# Patient Record
Sex: Female | Born: 1990 | Race: Black or African American | Marital: Single | Smoking: Former smoker
Health system: Southern US, Community
[De-identification: ages and names within clinical notes are randomized; demographics above are authoritative.]

## PROBLEM LIST (undated history)

## (undated) HISTORY — PX: INDUCED ABORTION: SHX677

---

## 2014-08-08 HISTORY — PX: LEEP: SHX91

## 2016-05-07 ENCOUNTER — Emergency Department: Payer: Medicaid Other

## 2016-05-07 ENCOUNTER — Emergency Department
Admission: EM | Admit: 2016-05-07 | Discharge: 2016-05-07 | Disposition: A | Discharge status: Home / Self Care | Payer: Medicaid Other | Attending: Student | Admitting: Student

## 2016-05-07 ENCOUNTER — Encounter: Payer: Self-pay | Admitting: Emergency Medicine

## 2016-05-07 DIAGNOSIS — O26899 Other specified pregnancy related conditions, unspecified trimester: Secondary | ICD-10-CM

## 2016-05-07 DIAGNOSIS — Z3A00 Weeks of gestation of pregnancy not specified: Secondary | ICD-10-CM | POA: Insufficient documentation

## 2016-05-07 DIAGNOSIS — M5442 Lumbago with sciatica, left side: Secondary | ICD-10-CM | POA: Insufficient documentation

## 2016-05-07 DIAGNOSIS — O99331 Smoking (tobacco) complicating pregnancy, first trimester: Secondary | ICD-10-CM | POA: Diagnosis not present

## 2016-05-07 DIAGNOSIS — B373 Candidiasis of vulva and vagina: Secondary | ICD-10-CM | POA: Diagnosis not present

## 2016-05-07 DIAGNOSIS — O200 Threatened abortion: Secondary | ICD-10-CM

## 2016-05-07 DIAGNOSIS — R102 Pelvic and perineal pain: Secondary | ICD-10-CM | POA: Diagnosis not present

## 2016-05-07 DIAGNOSIS — F17200 Nicotine dependence, unspecified, uncomplicated: Secondary | ICD-10-CM | POA: Diagnosis not present

## 2016-05-07 DIAGNOSIS — R109 Unspecified abdominal pain: Secondary | ICD-10-CM

## 2016-05-07 DIAGNOSIS — M5432 Sciatica, left side: Secondary | ICD-10-CM

## 2016-05-07 DIAGNOSIS — B3731 Acute candidiasis of vulva and vagina: Secondary | ICD-10-CM

## 2016-05-07 LAB — COMPREHENSIVE METABOLIC PANEL
ALBUMIN: 3.7 g/dL (ref 3.5–5.0)
ALK PHOS: 62 U/L (ref 38–126)
ALT: 31 U/L (ref 14–54)
AST: 22 U/L (ref 15–41)
Anion gap: 4 — ABNORMAL LOW (ref 5–15)
BILIRUBIN TOTAL: 0.5 mg/dL (ref 0.3–1.2)
BUN: 13 mg/dL (ref 6–20)
CALCIUM: 8.9 mg/dL (ref 8.9–10.3)
CO2: 25 mmol/L (ref 22–32)
Chloride: 108 mmol/L (ref 101–111)
Creatinine, Ser: 0.67 mg/dL (ref 0.44–1.00)
GFR calc Af Amer: >60 mL/min (ref >60)
GFR calc non Af Amer: >60 mL/min (ref >60)
GLUCOSE: 94 mg/dL (ref 65–99)
Potassium: 4 mmol/L (ref 3.5–5.1)
Sodium: 137 mmol/L (ref 135–145)
TOTAL PROTEIN: 7.3 g/dL (ref 6.5–8.1)

## 2016-05-07 LAB — CBC WITH DIFFERENTIAL/PLATELET
BASOS ABS: 0 10*3/uL (ref 0–0.1)
BASOS PCT: 1 %
Eosinophils Absolute: 0.1 10*3/uL (ref 0–0.7)
Eosinophils Relative: 2 %
HEMATOCRIT: 38.1 % (ref 35.0–47.0)
HEMOGLOBIN: 13.2 g/dL (ref 12.0–16.0)
Lymphocytes Relative: 35 %
Lymphs Abs: 2.9 10*3/uL (ref 1.0–3.6)
MCH: 29.3 pg (ref 26.0–34.0)
MCHC: 34.6 g/dL (ref 32.0–36.0)
MCV: 84.9 fL (ref 80.0–100.0)
MONOS PCT: 8 %
Monocytes Absolute: 0.7 10*3/uL (ref 0.2–0.9)
NEUTROS ABS: 4.4 10*3/uL (ref 1.4–6.5)
NEUTROS PCT: 54 %
Platelets: 234 10*3/uL (ref 150–440)
RBC: 4.48 MIL/uL (ref 3.80–5.20)
RDW: 13.9 % (ref 11.5–14.5)
WBC: 8.2 10*3/uL (ref 3.6–11.0)

## 2016-05-07 LAB — URINALYSIS COMPLETE WITH MICROSCOPIC (ARMC ONLY)
Bilirubin Urine: NEGATIVE
GLUCOSE, UA: NEGATIVE mg/dL
Ketones, ur: NEGATIVE mg/dL
Nitrite: NEGATIVE
PROTEIN: NEGATIVE mg/dL
Specific Gravity, Urine: 1.025 (ref 1.005–1.030)
pH: 5 (ref 5.0–8.0)

## 2016-05-07 LAB — WET PREP, GENITAL
CLUE CELLS WET PREP: NONE SEEN
Sperm: NONE SEEN
Trich, Wet Prep: NONE SEEN

## 2016-05-07 LAB — CHLAMYDIA/NGC RT PCR (ARMC ONLY)
Chlamydia Tr: NOT DETECTED
N GONORRHOEAE: NOT DETECTED

## 2016-05-07 LAB — LIPASE, BLOOD: Lipase: 22 U/L (ref 11–51)

## 2016-05-07 LAB — POCT PREGNANCY, URINE: PREG TEST UR: POSITIVE — AB

## 2016-05-07 LAB — HCG, QUANTITATIVE, PREGNANCY: hCG, Beta Chain, Quant, S: 11987 m[IU]/mL — ABNORMAL HIGH (ref <5)

## 2016-05-07 MED ORDER — ACETAMINOPHEN 500 MG PO TABS: 1000.0000 mg | ORAL_TABLET | Freq: Three times a day (TID) | ORAL | 0 refills | Status: AC | PRN

## 2016-05-07 MED ORDER — CLOTRIMAZOLE 1 % VA CREA: 1.0000 | TOPICAL_CREAM | Freq: Every day | VAGINAL | 0 refills | Status: AC

## 2016-05-07 MED ORDER — ACETAMINOPHEN 500 MG PO TABS
1000.0000 mg | ORAL_TABLET | Freq: Once | ORAL | Status: AC
  Administered 2016-05-07: 1000 mg via ORAL
  Filled 2016-05-07: qty 2

## 2016-05-07 NOTE — ED Triage Notes (Addendum)
Pt report left sided abdominal pain and left buttocks pain that worsened since yesterday. Pt reports nausea, but denies vomiting.  Pt states buttocks pain on left side radiates down left leg. Pt is ambulatory in triage. Pt states she took a pregnancy test on Monday and it was positive.

## 2016-05-07 NOTE — ED Provider Notes (Signed)
[facility] Provider Note   ____________________________________________   First MD Initiated Contact with Patient 05/07/16 0945     (approximate)  I have reviewed the triage vital signs and the nursing notes.   HISTORY  Chief Complaint Abdominal Pain    HPI Julie Zhang Legacy is a 25 y.o. female G3P1011 at approximately 5 weeks estimated gestational age by last menstrual period who presents for evaluation of one week of left lumbar back pain radiating down the posterior thigh, gradual onset, intermittent, currently moderate, no modifying factors. She denies any fevers, trauma to the back, history of malignancy, numbness or weakness in the legs, loss of control of bowel or bladder. She also reports some intermittent dull lower abdominal pain and recently took a pregnancy test that was positive at home. She has had thick vaginal discharge as well as vaginal itching. She denies any abnormal vaginal bleeding. She was treated for Trichomonas last month. No nausea, vomiting, diarrhea, fevers or chills.   History reviewed. No pertinent past medical history.  There are no active problems to display for this patient.   Past Surgical History:  Procedure Laterality Date  . INDUCED ABORTION      Prior to Admission medications   Not on File    Allergies Iodine  History reviewed. No pertinent family history.  Social History Social History  Substance Use Topics  . Smoking status: Current Some Day Smoker  . Smokeless tobacco: Never Used  . Alcohol use Yes    Review of Systems Constitutional: No fever/chills Eyes: No visual changes. ENT: No sore throat. Cardiovascular: Denies chest pain. Respiratory: Denies shortness of breath. Gastrointestinal: +abdominal pain.  No nausea, no vomiting.  No diarrhea.  No constipation. Genitourinary: Negative for dysuria. Musculoskeletal: Positive for lumbar back pain. Skin: Negative for  rash. Neurological: Negative for headaches, focal weakness or numbness.  10-point ROS otherwise negative.  ____________________________________________   PHYSICAL EXAM:  Vitals:   05/07/16 0917 05/07/16 0918 05/07/16 1042 05/07/16 1305  BP:  100/66 109/70 107/69  Pulse:  75  65  Resp:  18 16 16   Temp:  98.3 F (36.8 C)    TempSrc:  Oral    SpO2:  99% 99% 99%  Weight: 166 lb (75.3 kg)     Height: 5\' 1"  (1.549 m)       VITAL SIGNS: ED Triage Vitals  Enc Vitals Group     BP 05/07/16 0918 100/66     Pulse Rate 05/07/16 0918 75     Resp 05/07/16 0918 18     Temp 05/07/16 0918 98.3 F (36.8 C)     Temp Source 05/07/16 0918 Oral     SpO2 05/07/16 0918 99 %     Weight 05/07/16 0917 166 lb (75.3 kg)     Height 05/07/16 0917 5\' 1"  (1.549 m)     Head Circumference --      Peak Flow --      Pain Score 05/07/16 0918 8     Pain Loc --      Pain Edu? --      Excl. in GC? --     Constitutional: Alert and oriented. Well appearing and in no acute distress. Eyes: Conjunctivae are normal. PERRL. EOMI. Head: Atraumatic. Nose: No congestion/rhinnorhea. Mouth/Throat: Mucous membranes are moist.  Oropharynx non-erythematous. Neck: No stridor.  Supple without meningismus. Cardiovascular: Normal rate, regular rhythm. Grossly normal heart sounds.  Good peripheral circulation. Respiratory: Normal respiratory effort.  No retractions. Lungs CTAB.  Gastrointestinal: Soft and nontender. No distention. Normal bowel sounds No CVA tenderness.  Pelvic: Thick heterogeneous white discharge from a closed os, no blood, no bimanual or cervical motion tenderness. Musculoskeletal: No lower extremity tenderness nor edema.  No joint effusions. No midline T or L-spine tenderness to palpation, no tenderness to palpation throughout the paravertebral muscles. Neurologic:  Normal speech and language. No gross focal neurologic deficits are appreciated. No gait instability. Positive straight leg raise at 45 in the  left leg. 5 out of 5 strength of dorsiflexion of the big toes bilaterally. Skin:  Skin is warm, dry and intact. No rash noted. Psychiatric: Mood and affect are normal. Speech and behavior are normal.  ____________________________________________   LABS (all labs ordered are listed, but only abnormal results are displayed)  Labs Reviewed  WET PREP, GENITAL - Abnormal; Notable for the following:       Result Value   Yeast Wet Prep HPF POC PRESENT (*)    WBC, Wet Prep HPF POC FEW (*)    All other components within normal limits  COMPREHENSIVE METABOLIC PANEL - Abnormal; Notable for the following:    Anion gap 4 (*)    All other components within normal limits  URINALYSIS COMPLETEWITH MICROSCOPIC ([facility] ONLY) - Abnormal; Notable for the following:    Color, Urine YELLOW (*)    APPearance HAZY (*)    Hgb urine dipstick 1+ (*)    Leukocytes, UA TRACE (*)    Bacteria, UA RARE (*)    Squamous Epithelial / LPF 6-30 (*)    All other components within normal limits  HCG, QUANTITATIVE, PREGNANCY - Abnormal; Notable for the following:    hCG, Beta Chain, Quant, S 11,987 (*)    All other components within normal limits  POCT PREGNANCY, URINE - Abnormal; Notable for the following:    Preg Test, Ur POSITIVE (*)    All other components within normal limits  CHLAMYDIA/NGC RT PCR ([facility] ONLY)  CBC WITH DIFFERENTIAL/PLATELET  LIPASE, BLOOD  POC URINE PREG, ED   ____________________________________________  EKG  none ____________________________________________  RADIOLOGY  Transvaginal ultrasound IMPRESSION:  1. Probable early intrauterine pregnancy with a well-formed  gestational sac and yolk sac, but no embryo. Recommend follow-up  ultrasound in 7-10 days to document normal progression.  2. Small subchronic hemorrhage.  3. Moderate pelvic free fluid greater than generally seen for  physiologic free fluid. This is of unclear etiology.  4. No adnexal masses. Complex right  ovarian cyst consistent with a  corpus luteum.     ____________________________________________   PROCEDURES  Procedure(s) performed: None  Procedures  Critical Care performed: No  ____________________________________________   INITIAL IMPRESSION / ASSESSMENT AND PLAN / ED COURSE  Pertinent labs & imaging results that were available during my care of the patient were reviewed by me and considered in my medical decision making (see chart for details).  Raymonde Hamblin is a 25 y.o. female G3P1011 at approximately 5 weeks estimated gestational age by last menstrual period who presents for evaluation of one week of left lumbar back pain radiating down the posterior thigh. On exam, she is very well-appearing and in no acute distress. Her vital signs stable and she is afebrile. She is an intact neurological examination, no red flecks concerning for cauda equina or epidural abscess, suspect her back pain is secondary to sciatica, we'll treat with Tylenol. She has a benign abdominal exam however given her complaint of mild abdominal pain in early pregnancy we'll obtain screening labs, hCG,  transvaginal ultrasound to rule out ectopic. Reassess for disposition.  ----------------------------------------- 1:21 PM on 05/07/2016 ----------------------------------------- Patient with improvement back pain with Tylenol. She continues to appear well. Her labs, CBC unremarkable. Unremarkable CMP, normal lipase. Urinalysis is not consistent with infection. HCG was elevated greater than 11,000. Transvaginal ultrasound shows probable IUP with gestational and yolk sac present though no embryo is visualized. There is also a right-sided ovarian cyst as well as increased free fluid however I discussed this with Dr. Bonney Aid of OB/GYN who reports that this is nonspecific and in the absence of leukocytosis or fever or severe pain, it could represent a ruptured corpus luteum. He recommends follow-up in clinic. I  discussed this with the patient, we discussed thereturn precautions and need for close follow-up and she is comfortable with discharge plan. She does have a yeast infection we'll discharge with topical clotrimazole.  Clinical Course  Value Comment By Time  Protein: NEGATIVE (Reviewed) Gayla Doss, MD 09/30 1321     ____________________________________________   FINAL CLINICAL IMPRESSION(S) / ED DIAGNOSES  Final diagnoses:  Sciatica of left side  Abdominal pain in pregnancy  Yeast vaginitis  Threatened miscarriage in early pregnancy      NEW MEDICATIONS STARTED DURING THIS VISIT:  New Prescriptions   No medications on file     Note:  This document was prepared using Dragon voice recognition software and may include unintentional dictation errors.    Gayla Doss, MD 05/07/16 1324

## 2016-05-31 ENCOUNTER — Other Ambulatory Visit: Payer: Self-pay | Admitting: Obstetrics and Gynecology

## 2016-05-31 DIAGNOSIS — Z369 Encounter for antenatal screening, unspecified: Secondary | ICD-10-CM

## 2016-06-27 ENCOUNTER — Ambulatory Visit
Admission: RE | Admit: 2016-06-27 | Discharge: 2016-06-27 | Disposition: A | Discharge status: Home / Self Care | Payer: Medicaid Other | Source: Ambulatory Visit | Attending: Maternal & Fetal Medicine | Admitting: Maternal & Fetal Medicine

## 2016-06-27 DIAGNOSIS — Z315 Encounter for genetic counseling: Secondary | ICD-10-CM | POA: Diagnosis not present

## 2016-06-27 DIAGNOSIS — Z369 Encounter for antenatal screening, unspecified: Secondary | ICD-10-CM | POA: Insufficient documentation

## 2016-06-27 NOTE — Progress Notes (Signed)
Pt seen by me.  Agree with assessment and plan as outline by CGC Wells.  

## 2016-06-27 NOTE — Progress Notes (Signed)
Referring physician:  [facility] Length of Consultation: 40 minutes   Julie Zhang  was referred to [facility] for genetic counseling to review prenatal screening and testing options.  This note summarizes the information we discussed.    We offered the following routine screening tests for this pregnancy:  First trimester screening, which includes nuchal translucency ultrasound screen and first trimester maternal serum marker screening.  The nuchal translucency has approximately an 80% detection rate for Down syndrome and can be positive for other chromosome abnormalities as well as congenital heart defects.  When combined with a maternal serum marker screening, the detection rate is up to 90% for Down syndrome and up to 97% for trisomy 18.     Maternal serum marker screening, a blood test that measures pregnancy proteins, can provide risk assessments for Down syndrome, trisomy 18, and open neural tube defects (spina bifida, anencephaly). Because it does not directly examine the fetus, it cannot positively diagnose or rule out these problems.  Targeted ultrasound uses high frequency sound waves to create an image of the developing fetus.  An ultrasound is often recommended as a routine means of evaluating the pregnancy.  It is also used to screen for fetal anatomy problems (for example, a heart defect) that might be suggestive of a chromosomal or other abnormality.   Should these screening tests indicate an increased concern, then the following additional testing options would be offered:  The chorionic villus sampling procedure is available for first trimester chromosome analysis.  This involves the withdrawal of a small amount of chorionic villi (tissue from the developing placenta).  Risk of pregnancy loss is estimated to be approximately 1 in 200 to 1 in 100 (0.5 to 1%).  There is approximately a 1% (1 in 100) chance that the CVS chromosome results will be unclear.   Chorionic villi cannot be tested for neural tube defects.     Amniocentesis involves the removal of a small amount of amniotic fluid from the sac surrounding the fetus with the use of a thin needle inserted through the maternal abdomen and uterus.  Ultrasound guidance is used throughout the procedure.  Fetal cells from amniotic fluid are directly evaluated and > 99.5% of chromosome problems and > 98% of open neural tube defects can be detected. This procedure is generally performed after the 15th week of pregnancy.  The main risks to this procedure include complications leading to miscarriage in less than 1 in 200 cases (0.5%).  As another option for information if the pregnancy is suspected to be an an increased chance for certain chromosome conditions, we also reviewed the availability of cell free fetal DNA testing from maternal blood to determine whether or not the baby may have either Down syndrome, trisomy 64, or trisomy 3.  This test utilizes a maternal blood sample and DNA sequencing technology to isolate circulating cell free fetal DNA from maternal plasma.  The fetal DNA can then be analyzed for DNA sequences that are derived from the three most common chromosomes involved in aneuploidy, chromosomes 13, 18, and 21.  If the overall amount of DNA is greater than the expected level for any of these chromosomes, aneuploidy is suspected.  While we do not consider it a replacement for invasive testing and karyotype analysis, a negative result from this testing would be reassuring, though not a guarantee of a normal chromosome complement for the baby.  An abnormal result is certainly suggestive of an abnormal chromosome complement, though  we would still recommend CVS or amniocentesis to confirm any findings from this testing.   Cystic Fibrosis and Spinal Muscular Atrophy (SMA) screening were also discussed with the patient. Both conditions are recessive, which means that both parents must be carriers in  order to have a child with the disease.  Cystic fibrosis (CF) is one of the most common genetic conditions in persons of Caucasian ancestry.  This condition occurs in approximately 1 in 2,500 Caucasian persons and results in thickened secretions in the lungs, digestive, and reproductive systems.  For a baby to be at risk for having CF, both of the parents must be carriers for this condition.  Approximately 1 in 5025 Caucasian persons is a carrier for CF.  Current carrier testing looks for the most common mutations in the gene for CF and can detect approximately 90% of carriers in the Caucasian population.  This means that the carrier screening can greatly reduce, but cannot eliminate, the chance for an individual to have a child with CF.  If an individual is found to be a carrier for CF, then carrier testing would be available for the partner. As part of [place] newborn screening profile, all babies born in the state of [place] will have a two-tier screening process.  Specimens are first tested to determine the concentration of immunoreactive trypsinogen (IRT).  The top 5% of specimens with the highest IRT values then undergo DNA testing using a panel of over 40 common CF mutations. SMA is a neurodegenerative disorder that leads to atrophy of skeletal muscle and overall weakness.  This condition is also more prevalent in the Caucasian population, with 1 in 40-1 in 60 persons being a carrier and 1 in 6,000-1 in 10,000 children being affected.  There are multiple forms of the disease, with some causing death in infancy to other forms with survival into adulthood.  The genetics of SMA is complex, but carrier screening can detect up to 95% of carriers in the Caucasian population.  Similar to CF, a negative result can greatly reduce, but cannot eliminate, the chance to have a child with SMA.  We obtained a detailed family history and pregnancy history.  Julie Zhang reported that her mother and maternal aunt  both had colon cancer.  We reviewed that most cases of colon cancer occur by chance, but that there are some families with strong genetic factors.  If she would like to speak with someone about this history, we are happy to connect her with a cancer genetic counselor.  We would recommend that she follow screening guidelines as recommended by her primary care doctor.  She also stated that she has twin maternal first cousins once removed with alopecia, or hair loss.  There may be different types of this condition and different causes.  Without additional medical information it is difficult to determine if other family members would be at increased risk for a similar condition.  The remainder of the family history was reported to be unremarkable for birth defects, mental retardation, recurrent pregnancy loss or known chromosome abnormalities.  Ms. Scherrie Zhang stated that this is her third pregnancy, the first with her current partner.  She has one daughter who is in good health and had one elective termination.  She reported no complications in the current pregnancy.  The patient did report stopping both alcohol use and cigarette smoking at 4-[redacted] weeks gestation when she learned that she was pregnancy.  This is likely during the all or none period.  After consideration of the options, Ms. Shahara Zhang elected to proceed with an ultrasound and to declined all other screening and testing.  An ultrasound was performed at the time of the visit.  The gestational age was consistent with 12 weeks.  Fetal anatomy could not be assessed due to early gestational age.  Please refer to the ultrasound report for details of that study.  Ms. Monita Zhang was encouraged to call with questions or concerns.  We can be contacted at [phone].    Cherly Andersoneborah F. Danissa Rundle, MS, CGC

## 2016-08-08 NOTE — L&D Delivery Note (Signed)
Delivery Note At 6:48 PM on 01/09/17 a viable female was delivered via Vaginal, Spontaneous Delivery (Presentation: ;vtx- ROA  ).  APGAR: 8, 9; weight 6 lb 9 oz (2977 g).   Placenta status:intact  , .  Cord: 3v delayed cord clamping  with the following complications:none .    Anesthesia:  N2O2, lidocaine Episiotomy: None Lacerations: second  Suture Repair: 2.0 3.0 vicryl Est. Blood Loss (mL):  200 cc  Mom to postpartum.  Baby to Couplet care / Skin to Skin.  Ihor Austinhomas J Schermerhorn 01/09/2017, 7:05 PM

## 2017-01-09 ENCOUNTER — Inpatient Hospital Stay
Admission: EM | Admit: 2017-01-09 | Discharge: 2017-01-11 | DRG: 767 | Disposition: A | Discharge status: Home / Self Care | Payer: Medicaid Other | Attending: Obstetrics and Gynecology | Admitting: Obstetrics and Gynecology

## 2017-01-09 DIAGNOSIS — Z369 Encounter for antenatal screening, unspecified: Secondary | ICD-10-CM

## 2017-01-09 DIAGNOSIS — Z3A41 41 weeks gestation of pregnancy: Secondary | ICD-10-CM

## 2017-01-09 DIAGNOSIS — Z87891 Personal history of nicotine dependence: Secondary | ICD-10-CM | POA: Diagnosis not present

## 2017-01-09 DIAGNOSIS — Z302 Encounter for sterilization: Secondary | ICD-10-CM | POA: Diagnosis not present

## 2017-01-09 DIAGNOSIS — O479 False labor, unspecified: Secondary | ICD-10-CM | POA: Diagnosis present

## 2017-01-09 DIAGNOSIS — O480 Post-term pregnancy: Principal | ICD-10-CM | POA: Diagnosis present

## 2017-01-09 LAB — CBC
HCT: 30 % — ABNORMAL LOW (ref 35.0–47.0)
HEMOGLOBIN: 9.7 g/dL — AB (ref 12.0–16.0)
MCH: 24.9 pg — AB (ref 26.0–34.0)
MCHC: 32.4 g/dL (ref 32.0–36.0)
MCV: 76.8 fL — ABNORMAL LOW (ref 80.0–100.0)
PLATELETS: 284 10*3/uL (ref 150–440)
RBC: 3.91 MIL/uL (ref 3.80–5.20)
RDW: 15.3 % — ABNORMAL HIGH (ref 11.5–14.5)
WBC: 8.6 10*3/uL (ref 3.6–11.0)

## 2017-01-09 LAB — TYPE AND SCREEN
ABO/RH(D): AB POS
ANTIBODY SCREEN: NEGATIVE

## 2017-01-09 MED ORDER — MEASLES, MUMPS & RUBELLA VAC ~~LOC~~ INJ
0.5000 mL | INJECTION | Freq: Once | SUBCUTANEOUS | Status: DC
  Filled 2017-01-09: qty 0.5

## 2017-01-09 MED ORDER — OXYTOCIN 40 UNITS IN LACTATED RINGERS INFUSION - SIMPLE MED: 2.5000 [IU]/h | INTRAVENOUS | Status: DC

## 2017-01-09 MED ORDER — AMMONIA AROMATIC IN INHA
RESPIRATORY_TRACT | Status: AC
  Filled 2017-01-09: qty 10

## 2017-01-09 MED ORDER — TERBUTALINE SULFATE 1 MG/ML IJ SOLN: 0.2500 mg | Freq: Once | INTRAMUSCULAR | Status: DC | PRN

## 2017-01-09 MED ORDER — SENNOSIDES-DOCUSATE SODIUM 8.6-50 MG PO TABS
2.0000 | ORAL_TABLET | ORAL | Status: DC
  Administered 2017-01-10 (×2): 2 via ORAL
  Filled 2017-01-09 (×2): qty 2

## 2017-01-09 MED ORDER — BENZOCAINE-MENTHOL 20-0.5 % EX AERO
1.0000 "application " | INHALATION_SPRAY | CUTANEOUS | Status: DC | PRN
  Administered 2017-01-10: 1 via TOPICAL
  Filled 2017-01-09: qty 56

## 2017-01-09 MED ORDER — ACETAMINOPHEN 325 MG PO TABS: 650.0000 mg | ORAL_TABLET | ORAL | Status: DC | PRN

## 2017-01-09 MED ORDER — ACETAMINOPHEN 325 MG PO TABS
650.0000 mg | ORAL_TABLET | ORAL | Status: DC | PRN
  Administered 2017-01-10: 650 mg via ORAL
  Filled 2017-01-09: qty 2

## 2017-01-09 MED ORDER — IBUPROFEN 600 MG PO TABS
ORAL_TABLET | ORAL | Status: AC
  Filled 2017-01-09: qty 1

## 2017-01-09 MED ORDER — OXYTOCIN 40 UNITS IN LACTATED RINGERS INFUSION - SIMPLE MED
1.0000 m[IU]/min | INTRAVENOUS | Status: DC
  Administered 2017-01-09: 2 m[IU]/min via INTRAVENOUS

## 2017-01-09 MED ORDER — OXYCODONE-ACETAMINOPHEN 5-325 MG PO TABS
1.0000 | ORAL_TABLET | ORAL | Status: DC | PRN
  Administered 2017-01-11: 1 via ORAL
  Filled 2017-01-09: qty 1

## 2017-01-09 MED ORDER — COCONUT OIL OIL: 1.0000 "application " | TOPICAL_OIL | Status: DC | PRN

## 2017-01-09 MED ORDER — IBUPROFEN 600 MG PO TABS
600.0000 mg | ORAL_TABLET | Freq: Four times a day (QID) | ORAL | Status: DC
  Administered 2017-01-09 – 2017-01-10 (×3): 600 mg via ORAL
  Filled 2017-01-09 (×2): qty 1

## 2017-01-09 MED ORDER — WITCH HAZEL-GLYCERIN EX PADS
1.0000 "application " | MEDICATED_PAD | CUTANEOUS | Status: DC | PRN
  Administered 2017-01-10: 1 via TOPICAL
  Filled 2017-01-09: qty 100

## 2017-01-09 MED ORDER — PRENATAL MULTIVITAMIN CH
1.0000 | ORAL_TABLET | Freq: Every day | ORAL | Status: DC
  Administered 2017-01-10: 1 via ORAL
  Filled 2017-01-09 (×2): qty 1

## 2017-01-09 MED ORDER — ONDANSETRON HCL 4 MG PO TABS: 4.0000 mg | ORAL_TABLET | ORAL | Status: DC | PRN

## 2017-01-09 MED ORDER — OXYTOCIN 40 UNITS IN LACTATED RINGERS INFUSION - SIMPLE MED
INTRAVENOUS | Status: AC
  Filled 2017-01-09: qty 1000

## 2017-01-09 MED ORDER — DIPHENHYDRAMINE HCL 25 MG PO CAPS: 25.0000 mg | ORAL_CAPSULE | Freq: Four times a day (QID) | ORAL | Status: DC | PRN

## 2017-01-09 MED ORDER — OXYTOCIN BOLUS FROM INFUSION
500.0000 mL | Freq: Once | INTRAVENOUS | Status: AC
  Administered 2017-01-09: 500 mL via INTRAVENOUS

## 2017-01-09 MED ORDER — LIDOCAINE HCL (PF) 1 % IJ SOLN
INTRAMUSCULAR | Status: AC
  Filled 2017-01-09: qty 30

## 2017-01-09 MED ORDER — MAGNESIUM HYDROXIDE 400 MG/5ML PO SUSP: 30.0000 mL | ORAL | Status: DC | PRN

## 2017-01-09 MED ORDER — OXYCODONE-ACETAMINOPHEN 5-325 MG PO TABS
2.0000 | ORAL_TABLET | ORAL | Status: DC | PRN
  Administered 2017-01-09 – 2017-01-10 (×2): 2 via ORAL
  Administered 2017-01-10: 1 via ORAL
  Administered 2017-01-10: 2 via ORAL
  Filled 2017-01-09 (×3): qty 2

## 2017-01-09 MED ORDER — ONDANSETRON HCL 4 MG/2ML IJ SOLN: 4.0000 mg | INTRAMUSCULAR | Status: DC | PRN

## 2017-01-09 MED ORDER — LACTATED RINGERS IV SOLN
500.0000 mL | INTRAVENOUS | Status: DC | PRN
  Administered 2017-01-09: 500 mL via INTRAVENOUS

## 2017-01-09 MED ORDER — BUTORPHANOL TARTRATE 1 MG/ML IJ SOLN
1.0000 mg | INTRAMUSCULAR | Status: DC | PRN
  Administered 2017-01-09: 1 mg via INTRAVENOUS
  Filled 2017-01-09: qty 2

## 2017-01-09 MED ORDER — OXYTOCIN 10 UNIT/ML IJ SOLN
INTRAMUSCULAR | Status: AC
  Filled 2017-01-09: qty 2

## 2017-01-09 MED ORDER — ZOLPIDEM TARTRATE 5 MG PO TABS: 5.0000 mg | ORAL_TABLET | Freq: Every evening | ORAL | Status: DC | PRN

## 2017-01-09 MED ORDER — OXYCODONE-ACETAMINOPHEN 5-325 MG PO TABS
ORAL_TABLET | ORAL | Status: AC
  Filled 2017-01-09: qty 2

## 2017-01-09 MED ORDER — LACTATED RINGERS IV SOLN
INTRAVENOUS | Status: DC
  Administered 2017-01-09: 14:00:00 via INTRAVENOUS

## 2017-01-09 MED ORDER — DIBUCAINE 1 % RE OINT
1.0000 "application " | TOPICAL_OINTMENT | RECTAL | Status: DC | PRN
  Administered 2017-01-10: 1 via RECTAL
  Filled 2017-01-09: qty 28

## 2017-01-09 MED ORDER — LIDOCAINE HCL (PF) 1 % IJ SOLN
30.0000 mL | INTRAMUSCULAR | Status: DC | PRN
  Administered 2017-01-09: 30 mL via SUBCUTANEOUS

## 2017-01-09 MED ORDER — MISOPROSTOL 200 MCG PO TABS
ORAL_TABLET | ORAL | Status: AC
  Filled 2017-01-09: qty 4

## 2017-01-09 MED ORDER — SIMETHICONE 80 MG PO CHEW: 80.0000 mg | CHEWABLE_TABLET | ORAL | Status: DC | PRN

## 2017-01-09 MED ORDER — FERROUS SULFATE 325 (65 FE) MG PO TABS
325.0000 mg | ORAL_TABLET | Freq: Two times a day (BID) | ORAL | Status: DC
  Administered 2017-01-10 (×2): 325 mg via ORAL
  Filled 2017-01-09 (×2): qty 1

## 2017-01-09 MED ORDER — ONDANSETRON HCL 4 MG/2ML IJ SOLN: 4.0000 mg | Freq: Four times a day (QID) | INTRAMUSCULAR | Status: DC | PRN

## 2017-01-09 MED ORDER — SOD CITRATE-CITRIC ACID 500-334 MG/5ML PO SOLN
30.0000 mL | ORAL | Status: DC | PRN
  Filled 2017-01-09: qty 30

## 2017-01-09 NOTE — OB Triage Note (Signed)
Pt G2P1 [id number] states she called office this morning because she missed her appointment on Friday. She was going to try to go for appointment today and called the office; they encouraged her to come to L&D because she stated she was contracting off and on. Pt rates pain 10/10 when she does contract. She states she has to stop what she is doing to focus on breathing. Denies vaginal bleeding or leaking of fluid. Pt states she has vaginal discharge: white in color, no odor, thick consistency. Pt states + FM. VSS. Monitors applied and assessing.

## 2017-01-09 NOTE — H&P (Signed)
Julie Zhang is a 26 y.o. female presenting for labor , 41+0 weeks  OB History    Gravida Para Term Preterm AB Living   3 1 1   1 1    SAB TAB Ectopic Multiple Live Births     1           History reviewed. No pertinent past medical history. Past Surgical History:  Procedure Laterality Date  . INDUCED ABORTION    . LEEP  2016   Family History: family history is not on file. Social History:  reports that she has quit smoking. She has never used smokeless tobacco. She reports that she does not drink alcohol or use drugs.     Maternal Diabetes: No Genetic Screening: Normal Maternal Ultrasounds/Referrals: Normal Fetal Ultrasounds or other Referrals:  None Maternal Substance Abuse:  No Significant Maternal Medications:  None Significant Maternal Lab Results:  None Other Comments:  None  ROS History Dilation: 2 Effacement (%): 90 Station: 0 Exam by:: Teofilo Lupinacci MD Blood pressure 120/68, pulse 98, temperature 98.1 F (36.7 C), temperature source Oral, resp. rate 16, height 5\' 2"  (1.575 m), weight 174 lb (78.9 kg), last menstrual period 03/28/2016. Exam   reassuring fetal monitor   irregular ctx  Physical Exam   Lungs CTA  CV RRR adb gravid  EFW 7.5# Prenatal labs: ABO, Rh:  ab+ Antibody:  neg Rubella:  IMM, varicella Imm  RPR:   neg  HBsAg:  neg   HIV:   Neg  GBS:   neg   Assessment/Plan: Post dates  Latent labor  AROM , clear  Pitocin augmentation  Stadol prn  CLE if she wants    Ihor Austinhomas J Makynlee Kressin 01/09/2017, 2:08 PM

## 2017-01-09 NOTE — Discharge Summary (Signed)
Obstetric Discharge Summary   Patient ID: Patient Name: Julie Zhang DOB: 13-Mar-1991 MRN: [id number]  Date of Admission: 01/09/2017 Date of Discharge: 01/11/17 [facility]: [facility]   Gestational Age at Delivery: [redacted]w[redacted]d   Antepartum complications:none Admitting Diagnosis:labor , post dates  Secondary Diagnoses: Patient Active Problem List   Diagnosis Date Noted  . Uterine contractions during pregnancy 01/09/2017  . First trimester screening 06/27/2016    Augmentation: AROM and Pitocin Complications: None Intrapartum complications/course:  Uncomplicated SVD on 01/09/17 at 1848. Second degree lac repaired  Date of Delivery: 01/09/17 at 1848 Delivered By: Julie Zhang  Delivery Type: spontaneous vaginal delivery Anesthesia:stadol , N2O2Placenta: sponatneous Laceration: second Episiotomy: none  Newborn Data: Live born unspecified sex  Birth Weight: 6 lb 9 oz (2977 g) APGAR: 8, 9      Postpartum Course  Patient had an uncomplicated postpartum course.  By time of discharge on PPD#2, her pain was controlled on oral pain medications; she had appropriate lochia and was ambulating, voiding without difficulty and tolerating regular diet.  She was deemed stable for discharge to home.     Labs: CBC Latest Ref Rng & Units 01/10/2017 01/09/2017 05/07/2016  WBC 3.6 - 11.0 K/uL 13.4(H) 8.6 8.2  Hemoglobin 12.0 - 16.0 g/dL 4.4(W) 1.0(U) 72.5  Hematocrit 35.0 - 47.0 % 29.7(L) 30.0(L) 38.1  Platelets 150 - 440 K/uL 260 284 234   AB POS  Physical exam:  BP 117/74 (BP Location: Left Arm)   Pulse 78   Temp 98.2 F (36.8 C) (Oral)   Resp 18   Ht 5\' 2"  (1.575 m)   Wt 78.9 kg (174 lb)   LMP 03/28/2016 (Exact Date)   SpO2 99%   Breastfeeding? Unknown   BMI 31.83 kg/m  General: alert and no distress Pulm: normal respiratory effort  Cv RRR Lochia: appropriate Abdomen: soft, NT Uterine Fundus: firm, below umbilicus Extremities: No evidence of DVT seen on physical exam. No lower  extremity edema.   Disposition: stable, discharge to home Baby Feeding: formula Baby Disposition: home with mom  Contraception:PP BTL performed 01/11/17  Prenatal Labs:     Plan:  Julie Zhang was discharged to home in good condition. [facility]  Discharge Instructions: Per After Visit Summary. Activity: Advance as tolerated. Pelvic rest for 6 weeks.  Refer to After Visit Summary Diet: Regular Discharge Medications: Norco 5/325 1po q 4-6 hrs  zofran 4 mg po q 6 hrs prn  Naprosyn 500 mg bid prn pain   Allergies as of 01/11/2017      Reactions   Iodine Anaphylaxis      Medication List    TAKE these medications   acetaminophen 500 MG tablet Commonly known as:  TYLENOL Take 2 tablets (1,000 mg total) by mouth every 8 (eight) hours as needed for moderate pain.   benzocaine-Menthol 20-0.5 % Aero Commonly known as:  DERMOPLAST Apply 1 application topically as needed for irritation (perineal discomfort).   ondansetron 4 MG tablet Commonly known as:  ZOFRAN Take 1 tablet (4 mg total) by mouth every 4 (four) hours as needed for nausea.      Outpatient follow up: 2 week Dr Feliberto Gottron    Signed:  Ihor Austin Everhett Bozard  01/11/17

## 2017-01-10 LAB — CBC
HCT: 29.7 % — ABNORMAL LOW (ref 35.0–47.0)
Hemoglobin: 9.6 g/dL — ABNORMAL LOW (ref 12.0–16.0)
MCH: 25.2 pg — AB (ref 26.0–34.0)
MCHC: 32.3 g/dL (ref 32.0–36.0)
MCV: 77.8 fL — AB (ref 80.0–100.0)
PLATELETS: 260 10*3/uL (ref 150–440)
RBC: 3.82 MIL/uL (ref 3.80–5.20)
RDW: 15.3 % — AB (ref 11.5–14.5)
WBC: 13.4 10*3/uL — ABNORMAL HIGH (ref 3.6–11.0)

## 2017-01-10 LAB — RPR: RPR: NONREACTIVE

## 2017-01-10 NOTE — Progress Notes (Signed)
Post Partum Day 1 Subjective: Doing well, no complaints.  Tolerating regular diet, pain with PO meds, voiding and ambulating without difficulty.  No CP SOB F/C N/V or leg pain no HA change of vision, RUQ/epigastric pain  Objective: BP 113/71 (BP Location: Left Arm)   Pulse 79   Temp 98 F (36.7 C) (Oral)   Resp 16   Ht 5\' 2"  (1.575 m)   Wt 78.9 kg (174 lb)   LMP 03/28/2016 (Exact Date)   SpO2 100%   Breastfeeding? Unknown   BMI 31.83 kg/m    Physical Exam:  General: NAD CV: RRR Pulm: nl effort, CTABL Lochia: moderate Uterine Fundus: fundus firm and below umbilicus DVT Evaluation: no cords, ttp LEs    Recent Labs  01/09/17 1414 01/10/17 0643  HGB 9.7* 9.6*  HCT 30.0* 29.7*  WBC 8.6 13.4*  PLT 284 260    Assessment/Plan: 26 y.o. [id number] postpartum day # 1 1. Routine postpartum care 2. Had signed tubal papers but we were not aware of this desire at time of delivery. Has not bee NPO and has been taking ibuprofen.  She wants the procedure while inpatient.  Will schedule for tomorrow.  Npo after midnight, d/c ibuprofen.  Keep saline lock.    ----- Ranae Plumberhelsea Cru Kritikos, MD Attending Obstetrician and Gynecologist [facility]

## 2017-01-10 NOTE — Progress Notes (Signed)
Notify Dr.Ward about pt's craving for cornstarch.

## 2017-01-11 ENCOUNTER — Encounter: Payer: Self-pay | Admitting: Obstetrics and Gynecology

## 2017-01-11 ENCOUNTER — Inpatient Hospital Stay: Payer: Medicaid Other | Admitting: Certified Registered"

## 2017-01-11 ENCOUNTER — Encounter
Admission: EM | Disposition: A | Discharge status: Home / Self Care | Payer: Self-pay | Source: Home / Self Care | Attending: Obstetrics and Gynecology

## 2017-01-11 HISTORY — PX: TUBAL LIGATION: SHX77

## 2017-01-11 SURGERY — LIGATION, FALLOPIAN TUBE, POSTPARTUM
Anesthesia: General | Laterality: Bilateral

## 2017-01-11 MED ORDER — SUCCINYLCHOLINE CHLORIDE 20 MG/ML IJ SOLN
INTRAMUSCULAR | Status: DC | PRN
  Administered 2017-01-11: 100 mg via INTRAVENOUS

## 2017-01-11 MED ORDER — ONDANSETRON HCL 4 MG PO TABS: 4.0000 mg | ORAL_TABLET | ORAL | 0 refills | Status: AC | PRN

## 2017-01-11 MED ORDER — DEXAMETHASONE SODIUM PHOSPHATE 10 MG/ML IJ SOLN
INTRAMUSCULAR | Status: DC | PRN
  Administered 2017-01-11: 10 mg via INTRAVENOUS

## 2017-01-11 MED ORDER — FENTANYL CITRATE (PF) 100 MCG/2ML IJ SOLN
INTRAMUSCULAR | Status: DC | PRN
Start: 2017-01-11 — End: 2017-01-11
  Administered 2017-01-11 (×2): 50 ug via INTRAVENOUS

## 2017-01-11 MED ORDER — BUPIVACAINE HCL 0.5 % IJ SOLN
INTRAMUSCULAR | Status: DC | PRN
  Administered 2017-01-11: 2 mL

## 2017-01-11 MED ORDER — GLYCOPYRROLATE 0.2 MG/ML IJ SOLN
INTRAMUSCULAR | Status: DC | PRN
  Administered 2017-01-11: 0.6 mg via INTRAVENOUS

## 2017-01-11 MED ORDER — MIDAZOLAM HCL 2 MG/2ML IJ SOLN
INTRAMUSCULAR | Status: DC | PRN
  Administered 2017-01-11: 2 mg via INTRAVENOUS

## 2017-01-11 MED ORDER — PROPOFOL 10 MG/ML IV BOLUS
INTRAVENOUS | Status: AC
  Filled 2017-01-11: qty 20

## 2017-01-11 MED ORDER — PROPOFOL 10 MG/ML IV BOLUS
INTRAVENOUS | Status: DC | PRN
  Administered 2017-01-11: 150 mg via INTRAVENOUS

## 2017-01-11 MED ORDER — LIDOCAINE HCL (PF) 2 % IJ SOLN
INTRAMUSCULAR | Status: AC
  Filled 2017-01-11: qty 2

## 2017-01-11 MED ORDER — ROCURONIUM BROMIDE 50 MG/5ML IV SOLN
INTRAVENOUS | Status: AC
  Filled 2017-01-11: qty 1

## 2017-01-11 MED ORDER — PHENYLEPHRINE HCL 10 MG/ML IJ SOLN
INTRAMUSCULAR | Status: AC
  Filled 2017-01-11: qty 1

## 2017-01-11 MED ORDER — MIDAZOLAM HCL 2 MG/2ML IJ SOLN
INTRAMUSCULAR | Status: AC
  Filled 2017-01-11: qty 2

## 2017-01-11 MED ORDER — DEXAMETHASONE SODIUM PHOSPHATE 10 MG/ML IJ SOLN
INTRAMUSCULAR | Status: AC
  Filled 2017-01-11: qty 1

## 2017-01-11 MED ORDER — LACTATED RINGERS IV SOLN
INTRAVENOUS | Status: DC | PRN
Start: 2017-01-11 — End: 2017-01-11
  Administered 2017-01-11: 08:00:00 via INTRAVENOUS

## 2017-01-11 MED ORDER — BENZOCAINE-MENTHOL 20-0.5 % EX AERO: 1.0000 "application " | INHALATION_SPRAY | CUTANEOUS | 1 refills | Status: AC | PRN

## 2017-01-11 MED ORDER — FENTANYL CITRATE (PF) 100 MCG/2ML IJ SOLN
25.0000 ug | INTRAMUSCULAR | Status: DC | PRN
  Administered 2017-01-11 (×2): 25 ug via INTRAVENOUS

## 2017-01-11 MED ORDER — NEOSTIGMINE METHYLSULFATE 10 MG/10ML IV SOLN
INTRAVENOUS | Status: AC
  Filled 2017-01-11: qty 1

## 2017-01-11 MED ORDER — ONDANSETRON HCL 4 MG/2ML IJ SOLN: 4.0000 mg | Freq: Once | INTRAMUSCULAR | Status: DC | PRN

## 2017-01-11 MED ORDER — GLYCOPYRROLATE 0.2 MG/ML IJ SOLN
INTRAMUSCULAR | Status: AC
  Filled 2017-01-11: qty 3

## 2017-01-11 MED ORDER — ROCURONIUM BROMIDE 100 MG/10ML IV SOLN
INTRAVENOUS | Status: DC | PRN
  Administered 2017-01-11: 5 mg via INTRAVENOUS
  Administered 2017-01-11: 25 mg via INTRAVENOUS

## 2017-01-11 MED ORDER — FENTANYL CITRATE (PF) 100 MCG/2ML IJ SOLN
INTRAMUSCULAR | Status: AC
  Filled 2017-01-11: qty 2

## 2017-01-11 MED ORDER — FENTANYL CITRATE (PF) 100 MCG/2ML IJ SOLN
INTRAMUSCULAR | Status: AC
  Administered 2017-01-11: 25 ug via INTRAVENOUS
  Filled 2017-01-11: qty 2

## 2017-01-11 MED ORDER — ONDANSETRON HCL 4 MG/2ML IJ SOLN
INTRAMUSCULAR | Status: DC | PRN
  Administered 2017-01-11: 4 mg via INTRAVENOUS

## 2017-01-11 MED ORDER — LIDOCAINE HCL (CARDIAC) 20 MG/ML IV SOLN
INTRAVENOUS | Status: DC | PRN
  Administered 2017-01-11: 40 mg via INTRAVENOUS

## 2017-01-11 MED ORDER — ONDANSETRON HCL 4 MG/2ML IJ SOLN
INTRAMUSCULAR | Status: AC
  Filled 2017-01-11: qty 2

## 2017-01-11 MED ORDER — BUPIVACAINE HCL (PF) 0.5 % IJ SOLN
INTRAMUSCULAR | Status: AC
  Filled 2017-01-11: qty 30

## 2017-01-11 MED ORDER — SUCCINYLCHOLINE CHLORIDE 20 MG/ML IJ SOLN
INTRAMUSCULAR | Status: AC
  Filled 2017-01-11: qty 1

## 2017-01-11 MED ORDER — NEOSTIGMINE METHYLSULFATE 10 MG/10ML IV SOLN
INTRAVENOUS | Status: DC | PRN
  Administered 2017-01-11: 4 mg via INTRAVENOUS

## 2017-01-11 SURGICAL SUPPLY — 32 items
APPLICATOR COTTON TIP 6IN STRL (MISCELLANEOUS) ×3 IMPLANT
BLADE SURG SZ11 CARB STEEL (BLADE) ×3 IMPLANT
CANISTER SUCT 1200ML W/VALVE (MISCELLANEOUS) ×3 IMPLANT
CHLORAPREP W/TINT 26ML (MISCELLANEOUS) ×3 IMPLANT
CLOSURE WOUND 1/4X4 (GAUZE/BANDAGES/DRESSINGS) ×1
DERMABOND ADVANCED (GAUZE/BANDAGES/DRESSINGS) ×2
DERMABOND ADVANCED .7 DNX12 (GAUZE/BANDAGES/DRESSINGS) ×1 IMPLANT
DRAPE LAPAROTOMY 100X77 ABD (DRAPES) ×3 IMPLANT
DRSG TEGADERM 2-3/8X2-3/4 SM (GAUZE/BANDAGES/DRESSINGS) ×3 IMPLANT
DRSG TEGADERM 4X4.75 (GAUZE/BANDAGES/DRESSINGS) ×3 IMPLANT
ELECT CAUTERY BLADE 6.4 (BLADE) ×3 IMPLANT
ELECT REM PT RETURN 9FT ADLT (ELECTROSURGICAL) ×3
ELECTRODE REM PT RTRN 9FT ADLT (ELECTROSURGICAL) ×1 IMPLANT
GAUZE SPONGE NON-WVN 2X2 STRL (MISCELLANEOUS) ×1 IMPLANT
GLOVE BIO SURGEON STRL SZ8 (GLOVE) ×9 IMPLANT
GOWN STRL REUS W/ TWL LRG LVL3 (GOWN DISPOSABLE) ×1 IMPLANT
GOWN STRL REUS W/ TWL XL LVL3 (GOWN DISPOSABLE) ×1 IMPLANT
GOWN STRL REUS W/TWL LRG LVL3 (GOWN DISPOSABLE) ×2
GOWN STRL REUS W/TWL XL LVL3 (GOWN DISPOSABLE) ×2
KIT RM TURNOVER STRD PROC AR (KITS) ×9 IMPLANT
LABEL OR SOLS (LABEL) ×3 IMPLANT
NEEDLE HYPO 25X1 1.5 SAFETY (NEEDLE) ×3 IMPLANT
NS IRRIG 500ML POUR BTL (IV SOLUTION) ×3 IMPLANT
PACK BASIN MINOR ARMC (MISCELLANEOUS) ×3 IMPLANT
SPONGE VERSALON 2X2 STRL (MISCELLANEOUS) ×2
STRIP CLOSURE SKIN 1/4X4 (GAUZE/BANDAGES/DRESSINGS) ×2 IMPLANT
SUT PLAIN GUT 0 (SUTURE) ×6 IMPLANT
SUT VIC AB 2-0 UR6 27 (SUTURE) ×3 IMPLANT
SUT VIC AB 4-0 SH 27 (SUTURE) ×2
SUT VIC AB 4-0 SH 27XANBCTRL (SUTURE) ×1 IMPLANT
SWABSTK COMLB BENZOIN TINCTURE (MISCELLANEOUS) ×3 IMPLANT
SYRINGE 10CC LL (SYRINGE) ×3 IMPLANT

## 2017-01-11 NOTE — Progress Notes (Signed)
PPD#2 desires pp BTL . Medicaid consent signed 10/12/16 . Reaffirms desire today . NPO . Labs checked . Risks explained to pt . All questions answered . proceed

## 2017-01-11 NOTE — Anesthesia Preprocedure Evaluation (Addendum)
Anesthesia Evaluation  Patient identified by MRN, date of birth, ID band Patient awake    Reviewed: Allergy & Precautions, NPO status , Patient's Chart, lab work & pertinent test results  Airway Mallampati: II       Dental  (+) Teeth Intact   Pulmonary neg pulmonary ROS, asthma , former smoker,    breath sounds clear to auscultation       Cardiovascular Exercise Tolerance: Good  Rhythm:Regular     Neuro/Psych negative neurological ROS  negative psych ROS   GI/Hepatic negative GI ROS, Neg liver ROS,   Endo/Other  negative endocrine ROS  Renal/GU negative Renal ROS     Musculoskeletal negative musculoskeletal ROS (+)   Abdominal Normal abdominal exam  (+)   Peds negative pediatric ROS (+)  Hematology negative hematology ROS (+)   Anesthesia Other Findings   Reproductive/Obstetrics                             Anesthesia Physical Anesthesia Plan  ASA: II  Anesthesia Plan: General   Post-op Pain Management:    Induction: Intravenous  PONV Risk Score and Plan: 0  Airway Management Planned: Oral ETT  Additional Equipment:   Intra-op Plan:   Post-operative Plan: Extubation in OR  Informed Consent: I have reviewed the patients History and Physical, chart, labs and discussed the procedure including the risks, benefits and alternatives for the proposed anesthesia with the patient or authorized representative who has indicated his/her understanding and acceptance.     Plan Discussed with: CRNA  Anesthesia Plan Comments:        Anesthesia Quick Evaluation

## 2017-01-11 NOTE — Anesthesia Post-op Follow-up Note (Cosign Needed)
Anesthesia QCDR form completed.        

## 2017-01-11 NOTE — Op Note (Signed)
NAMLynann Zhang:  Peden, Preeti                   ACCOUNT NO.:  [id number]  MEDICAL RECORD NO.:  [id number]  LOCATION:                                 FACILITY:  PHYSICIAN:  Jennell Cornerhomas Shanicqua Coldren, MD     DATE OF BIRTH:  DATE OF PROCEDURE:  01/11/2017 DATE OF DISCHARGE:                              OPERATIVE REPORT   PREOPERATIVE DIAGNOSIS:  Elective permanent sterilization.  POSTOPERATIVE DIAGNOSIS:  Elective permanent sterilization.  PROCEDURE PERFORMED:  Postpartum bilateral tubal ligation, [place].  SURGEON:  Jennell Cornerhomas Keontae Levingston, MD  ANESTHESIA:  General endotracheal anesthesia.  SURGEON:  Jennell Cornerhomas Robert Sperl, MD.  FIRST ASSISTANT:  Scrub tech, Doman.  INDICATIONS:  A 26 year old, gravida 3, now para 2, status post spontaneous vaginal delivery on January 09, 2017.  The patient had previously decided on permanent sterilization and reconfirms her desire on the day of the procedure.  The patient is aware of the failure rate of 1/300 per year, and risks have been explained to the patient.  DESCRIPTION OF PROCEDURE:  After adequate general endotracheal anesthesia, the patient was prepped and draped in a normal sterile fashion.  Time-out was performed.  A 15 mm infraumbilical incision was made after injected with 0.5% Marcaine.  Fascia was identified and opened and the peritoneal was opened sharply without difficulty. Attention directed to the patient's right fallopian tube which was identified.  The fimbriated end was visualized.  Two separate 0 plain gut sutures were applied at the midportion of the fallopian tube and a 1.5 cm portion of fallopian tube was removed.  Good hemostasis was noted.  Attention was directed to the patient's left fallopian tube which was identified again, and the fimbriated end was visualized and again 2 separate 0 plain gut sutures were applied at the midportion of fallopian tube, and a 1.5 cm portion of fallopian tube was removed. Good hemostasis was noted.  Fascia  was closed with a running 2-0 Vicryl suture, and skin was reapproximated with 4-0 Vicryl suture in an interrupted fashion.  Sterile dressing was applied.  There were no complications.  ESTIMATED BLOOD LOSS:  Minimal.  INTRAOPERATIVE FLUIDS:  700 mL.  The patient tolerated the procedure well and was taken to the recovery room in good condition.    ______________________________ Jennell Cornerhomas Vesper Trant, MD   ______________________________ Jennell Cornerhomas Taquilla Downum, MD    TS/MEDQ  D:  01/11/2017  T:  01/11/2017  Job:  [id number]

## 2017-01-11 NOTE — Anesthesia Postprocedure Evaluation (Signed)
Anesthesia Post Note  Patient: Julie Zhang  Procedure(s) Performed: Procedure(s) (LRB): POST PARTUM TUBAL LIGATION (Bilateral)  Patient location during evaluation: PACU Anesthesia Type: General Level of consciousness: awake Pain management: pain level controlled Vital Signs Assessment: post-procedure vital signs reviewed and stable Respiratory status: spontaneous breathing Cardiovascular status: stable Anesthetic complications: no     Last Vitals:  Vitals:   01/11/17 0910 01/11/17 0918  BP:  113/75  Pulse: 64 73  Resp: 15 15  Temp:  37.1 C    Last Pain:  Vitals:   01/11/17 0918  TempSrc:   PainSc: Asleep                 VAN STAVEREN,Hellon Vaccarella

## 2017-01-11 NOTE — Transfer of Care (Signed)
Immediate Anesthesia Transfer of Care Note  Patient: Julie Zhang  Procedure(s) Performed: Procedure(s): POST PARTUM TUBAL LIGATION (Bilateral)  Patient Location: PACU  Anesthesia Type:General  Level of Consciousness: awake, alert , oriented and patient cooperative  Airway & Oxygen Therapy: Patient Spontanous Breathing and Patient connected to face mask oxygen  Post-op Assessment: Report given to RN, Post -op Vital signs reviewed and stable and Patient moving all extremities X 4  Post vital signs: Reviewed and stable  Last Vitals:  Vitals:   01/11/17 0420 01/11/17 0848  BP: 117/74 121/75  Pulse: 78 93  Resp: 18 11  Temp: 36.8 C 37 C    Last Pain:  Vitals:   01/11/17 0633  TempSrc:   PainSc: 0-No pain      Patients Stated Pain Goal: 0 (01/09/17 1728)  Complications: No apparent anesthesia complications

## 2017-01-11 NOTE — Anesthesia Procedure Notes (Signed)
Procedure Name: Intubation Date/Time: 01/11/2017 7:52 AM Performed by: Silvana Newness Pre-anesthesia Checklist: Patient identified, Emergency Drugs available, Suction available, Patient being monitored and Timeout performed Patient Re-evaluated:Patient Re-evaluated prior to inductionOxygen Delivery Method: Circle system utilized Preoxygenation: Pre-oxygenation with 100% oxygen Intubation Type: IV induction Ventilation: Mask ventilation without difficulty Laryngoscope Size: Mac and 3 Grade View: Grade I Tube type: Oral Tube size: 7.0 mm Number of attempts: 1 Airway Equipment and Method: Stylet Placement Confirmation: ETT inserted through vocal cords under direct vision,  positive ETCO2 and breath sounds checked- equal and bilateral Secured at: 20 cm Tube secured with: Tape Dental Injury: Teeth and Oropharynx as per pre-operative assessment

## 2017-01-11 NOTE — Brief Op Note (Signed)
01/09/2017 - 01/11/2017  8:41 AM  PATIENT:  Julie BeaverNedra Zhang  26 y.o. female  PRE-OPERATIVE DIAGNOSIS:  desires sterility  POST-OPERATIVE DIAGNOSIS:  desires sterility  PROCEDURE:  Procedure(s): POST PARTUM TUBAL LIGATION (Bilateral)  SURGEON:  Surgeon(s) and Role:    * Schermerhorn, Ihor Austinhomas J, MD - Primary  PHYSICIAN ASSISTANT:   ASSISTANTS: scrub tech , Doman   ANESTHESIA:   general  EBL:  Total I/O In: 700 [I.V.:700] Out: 5 [Blood:5]  BLOOD ADMINISTERED:none  DRAINS: none   LOCAL MEDICATIONS USED:  MARCAINE     SPECIMEN:  Source of Specimen:  portion bilateral fallopian tubes  DISPOSITION OF SPECIMEN:  PATHOLOGY  COUNTS:  YES  TOURNIQUET:  * No tourniquets in log *  DICTATION: .Other Dictation: Dictation Number verbal  PLAN OF CARE: return to Postpartum   PATIENT DISPOSITION:  PACU - hemodynamically stable.   Delay start of Pharmacological VTE agent (>24hrs) due to surgical blood loss or risk of bleeding: not applicable

## 2017-01-11 NOTE — Progress Notes (Signed)
Discharge instr reviewed with pt.  Verb u/o of instructions.  Rx given for home use.

## 2017-01-11 NOTE — Progress Notes (Signed)
Discharged to home.  To car via auxillary. 

## 2017-01-12 LAB — SURGICAL PATHOLOGY

## 2017-02-07 DIAGNOSIS — Z1379 Encounter for other screening for genetic and chromosomal anomalies: Secondary | ICD-10-CM | POA: Insufficient documentation

## 2017-02-07 NOTE — Progress Notes (Addendum)
[facility]  Telephone:(336) [phone] Fax:(336) [phone]  ID: Julie Zhang OB: [birth year]  MR#: [id number]  QIO#:[id number]#:[id number]  Patient Care Team: Patient, No Pcp Per as PCP - General (General Practice)  CHIEF COMPLAINT: Genetic testing and counseling  INTERVAL HISTORY: Patient is a 26 year old female with no personal history of cancer, but had a mother who died of colon cancer in her 8350s and a maternal aunt who is battling colon cancer in her 3340s. She also believes she has a great maternal grandmother also with colon cancer. She does not believe any of her relatives including her mother were genetically tested. She currently feels well and is asymptomatic. She has no neurologic complaints. She denies any recent fevers or illnesses. She has a good appetite and denies weight loss. She has no chest pain or shortness of breath. She denies any nausea, vomiting, constipation, or diarrhea. She has no melena or hematochezia. She has noted no changes in her bowel movements. She has no urinary complaints. Patient feels at her baseline and offers no specific complaints today.  REVIEW OF SYSTEMS:   Review of Systems  Constitutional: Negative.  Negative for fever, malaise/fatigue and weight loss.  Respiratory: Negative.  Negative for cough and shortness of breath.   Cardiovascular: Negative.  Negative for chest pain and leg swelling.  Gastrointestinal: Negative.  Negative for abdominal pain, blood in stool, constipation, diarrhea, melena, nausea and vomiting.  Genitourinary: Negative.   Musculoskeletal: Negative.   Skin: Negative.  Negative for rash.  Neurological: Negative.  Negative for weakness.  Psychiatric/Behavioral: Negative.  The patient is not nervous/anxious.     As per HPI. Otherwise, a complete review of systems is negative.  PAST MEDICAL HISTORY: No past medical history on file.  PAST SURGICAL HISTORY: Past Surgical History:  Procedure Laterality Date  . INDUCED  ABORTION    . LEEP  2016  . TUBAL LIGATION Bilateral 01/11/2017   Procedure: POST PARTUM TUBAL LIGATION;  Surgeon: Schermerhorn, Ihor Austinhomas J, MD;  Location: [facility] ORS;  Service: Gynecology;  Laterality: Bilateral;    FAMILY HISTORY: Family History  Problem Relation Age of Onset  . Cancer Mother 2156       Colon  . Cancer Maternal Aunt 7149       Colon    ADVANCED DIRECTIVES (Y/N):  N  HEALTH MAINTENANCE: Social History  Substance Use Topics  . Smoking status: Former Games developermoker  . Smokeless tobacco: Never Used  . Alcohol use No     Colonoscopy:  PAP:  Bone density:  Lipid panel:  Allergies  Allergen Reactions  . Iodine Anaphylaxis    Current Outpatient Prescriptions  Medication Sig Dispense Refill  . acetaminophen (TYLENOL) 500 MG tablet Take 2 tablets (1,000 mg total) by mouth every 8 (eight) hours as needed for moderate pain. (Patient not taking: Reported on 01/09/2017) 20 tablet 0  . benzocaine-Menthol (DERMOPLAST) 20-0.5 % AERO Apply 1 application topically as needed for irritation (perineal discomfort). (Patient not taking: Reported on 02/09/2017) 1 each 1  . ondansetron (ZOFRAN) 4 MG tablet Take 1 tablet (4 mg total) by mouth every 4 (four) hours as needed for nausea. (Patient not taking: Reported on 02/09/2017) 20 tablet 0   No current facility-administered medications for this visit.     OBJECTIVE: Vitals:   02/09/17 1522  BP: 120/74  Pulse: 72  Resp: 20  Temp: 98.4 F (36.9 C)     Body mass index is 28.66 kg/m.    ECOG FS:0 - Asymptomatic  General: Well-developed,  well-nourished, no acute distress. Eyes: Pink conjunctiva, anicteric sclera. HEENT: Normocephalic, moist mucous membranes, clear oropharnyx. Musculoskeletal: No edema, cyanosis, or clubbing. Neuro: Alert, answering all questions appropriately. Cranial nerves grossly intact. Skin: No rashes or petechiae noted. Psych: Normal affect. Lymphatics: No cervical, calvicular, axillary or inguinal LAD.   LAB  RESULTS:  Lab Results  Component Value Date   NA 137 05/07/2016   K 4.0 05/07/2016   CL 108 05/07/2016   CO2 25 05/07/2016   GLUCOSE 94 05/07/2016   BUN 13 05/07/2016   CREATININE 0.67 05/07/2016   CALCIUM 8.9 05/07/2016   PROT 7.3 05/07/2016   ALBUMIN 3.7 05/07/2016   AST 22 05/07/2016   ALT 31 05/07/2016   ALKPHOS 62 05/07/2016   BILITOT 0.5 05/07/2016   GFRNONAA >60 05/07/2016   GFRAA >60 05/07/2016    Lab Results  Component Value Date   WBC 13.4 (H) 01/10/2017   NEUTROABS 4.4 05/07/2016   HGB 9.6 (L) 01/10/2017   HCT 29.7 (L) 01/10/2017   MCV 77.8 (L) 01/10/2017   PLT 260 01/10/2017     STUDIES: No results found.  ASSESSMENT: Genetic testing and counseling  PLAN:    1. Genetic testing and counseling:Has a significant family history her mother in her 61s deceased from colon cancer as well as a maternal aunt having colon cancer in her 23s. Her knowledge neither relative has been tested. Patient will benefit from testing for Lynch syndrome today. She has been instructed that have her results are negative, she is still at higher risk for colon cancer given her family history and should consider a baseline colonoscopy by the age of 66. If her results are positive, patient will return to clinic to discuss the results and additional prophylactic measures she can take. No follow-up has been scheduled at this time. 2. Anemia: Patient is postpartum and likely has iron deficiency. Continue treatment and monitoring per primary care.  Approximately 45 minutes was spent in discussion of which greater than 50% was consultation.  Patient expressed understanding and was in agreement with this plan. She also understands that She can call clinic at any time with any questions, concerns, or complaints.    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  The clinical significance of this is uncertain.  Jeralyn Ruths, MD 03/03/17 4:02 PM

## 2017-02-09 ENCOUNTER — Encounter (INDEPENDENT_AMBULATORY_CARE_PROVIDER_SITE_OTHER): Payer: Self-pay

## 2017-02-09 ENCOUNTER — Encounter: Payer: Self-pay | Admitting: Oncology

## 2017-02-09 ENCOUNTER — Inpatient Hospital Stay: Payer: Medicaid Other | Attending: Oncology | Admitting: Oncology

## 2017-02-09 DIAGNOSIS — Z79899 Other long term (current) drug therapy: Secondary | ICD-10-CM

## 2017-02-09 DIAGNOSIS — Z800 Family history of malignant neoplasm of digestive organs: Secondary | ICD-10-CM | POA: Insufficient documentation

## 2017-02-09 DIAGNOSIS — O9081 Anemia of the puerperium: Secondary | ICD-10-CM | POA: Diagnosis not present

## 2017-02-09 DIAGNOSIS — Z87891 Personal history of nicotine dependence: Secondary | ICD-10-CM | POA: Diagnosis not present

## 2017-02-09 DIAGNOSIS — Z3A00 Weeks of gestation of pregnancy not specified: Secondary | ICD-10-CM | POA: Insufficient documentation

## 2017-02-09 DIAGNOSIS — Z1379 Encounter for other screening for genetic and chromosomal anomalies: Secondary | ICD-10-CM | POA: Diagnosis present

## 2017-02-09 NOTE — Progress Notes (Signed)
Patient here today for initial evaluation regarding genetic testing.

## 2017-07-04 ENCOUNTER — Encounter: Payer: Self-pay | Admitting: Emergency Medicine

## 2017-07-04 ENCOUNTER — Emergency Department
Admission: EM | Admit: 2017-07-04 | Discharge: 2017-07-04 | Disposition: A | Discharge status: Home / Self Care | Payer: No Typology Code available for payment source | Attending: Emergency Medicine | Admitting: Emergency Medicine

## 2017-07-04 ENCOUNTER — Other Ambulatory Visit: Payer: Self-pay

## 2017-07-04 ENCOUNTER — Emergency Department: Payer: No Typology Code available for payment source

## 2017-07-04 DIAGNOSIS — Y92410 Unspecified street and highway as the place of occurrence of the external cause: Secondary | ICD-10-CM | POA: Insufficient documentation

## 2017-07-04 DIAGNOSIS — S060X0A Concussion without loss of consciousness, initial encounter: Secondary | ICD-10-CM | POA: Diagnosis not present

## 2017-07-04 DIAGNOSIS — Y999 Unspecified external cause status: Secondary | ICD-10-CM | POA: Insufficient documentation

## 2017-07-04 DIAGNOSIS — Z87891 Personal history of nicotine dependence: Secondary | ICD-10-CM | POA: Diagnosis not present

## 2017-07-04 DIAGNOSIS — S161XXA Strain of muscle, fascia and tendon at neck level, initial encounter: Secondary | ICD-10-CM | POA: Diagnosis not present

## 2017-07-04 DIAGNOSIS — Y939 Activity, unspecified: Secondary | ICD-10-CM | POA: Diagnosis not present

## 2017-07-04 DIAGNOSIS — S199XXA Unspecified injury of neck, initial encounter: Secondary | ICD-10-CM | POA: Diagnosis present

## 2017-07-04 DIAGNOSIS — S40021A Contusion of right upper arm, initial encounter: Secondary | ICD-10-CM | POA: Diagnosis not present

## 2017-07-04 MED ORDER — KETOROLAC TROMETHAMINE 10 MG PO TABS
10.0000 mg | ORAL_TABLET | Freq: Four times a day (QID) | ORAL | 0 refills | Status: AC | PRN
Start: 2017-07-04 — End: ?

## 2017-07-04 NOTE — ED Triage Notes (Signed)
No cervical tenderness on exam.
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
